# Patient Record
Sex: Male | Born: 1965 | Race: Black or African American | Hispanic: No | Marital: Single | Smoking: Never smoker
Health system: Southern US, Community
[De-identification: ages and names within clinical notes are randomized; demographics above are authoritative.]

## PROBLEM LIST (undated history)

## (undated) DIAGNOSIS — N200 Calculus of kidney: Secondary | ICD-10-CM

---

## 1997-10-27 ENCOUNTER — Emergency Department (HOSPITAL_COMMUNITY): Admission: EM | Admit: 1997-10-27 | Discharge: 1997-10-27 | Payer: Self-pay | Admitting: Emergency Medicine

## 2004-06-19 ENCOUNTER — Emergency Department (HOSPITAL_COMMUNITY): Admission: EM | Admit: 2004-06-19 | Discharge: 2004-06-19 | Payer: Self-pay | Admitting: Emergency Medicine

## 2015-05-18 ENCOUNTER — Ambulatory Visit (INDEPENDENT_AMBULATORY_CARE_PROVIDER_SITE_OTHER): Payer: BLUE CROSS/BLUE SHIELD | Admitting: Physician Assistant

## 2015-05-18 VITALS — BP 134/72 | HR 98 | Temp 98.6°F | Resp 16 | Ht 74.0 in | Wt 197.2 lb

## 2015-05-18 DIAGNOSIS — J069 Acute upper respiratory infection, unspecified: Secondary | ICD-10-CM | POA: Diagnosis not present

## 2015-05-18 DIAGNOSIS — B9789 Other viral agents as the cause of diseases classified elsewhere: Principal | ICD-10-CM

## 2015-05-18 NOTE — Progress Notes (Signed)
   Alan HughsReginald E Sliwa  MRN: [id number] DOB: 29-Mar-1965  Subjective:  Pt presents to clinic with about 2 week h/o cough that seems to be getting better but his dad was worried and he wanted him to be checked.  He does not have any additional symptoms of congestion, sore throat, fever or chills.  He has no problems with heartburn.  He sleeps ok at night.  The cough is now just intermittently through the day.  He was taking mucinex D and it was helping but then he stopped because he did not know how long he should take it at a time.  There are no active problems to display for this patient.   No current outpatient prescriptions on file prior to visit.   No current facility-administered medications on file prior to visit.    Allergies  Allergen Reactions  . Asa [Aspirin] Hives    Review of Systems  Constitutional: Negative for fever and chills.  HENT: Negative for congestion and sore throat.   Respiratory: Positive for cough (dry).        No h/o asthma, nonsmoker  Musculoskeletal: Negative for myalgias.  Neurological: Negative for headaches.   Objective:  BP 134/72 mmHg  Pulse 98  Temp(Src) 98.6 F (37 C) (Oral)  Resp 16  Ht 6\' 2"  (1.88 m)  Wt 197 lb 3.2 oz (89.449 kg)  BMI 25.31 kg/m2  SpO2 98%  Physical Exam  Constitutional: He is oriented to person, place, and time and well-developed, well-nourished, and in no distress.  HENT:  Head: Normocephalic and atraumatic.  Right Ear: Hearing, tympanic membrane, external ear and ear canal normal.  Left Ear: Hearing, tympanic membrane, external ear and ear canal normal.  Nose: Nose normal.  Mouth/Throat: Uvula is midline, oropharynx is clear and moist and mucous membranes are normal.  Eyes: Conjunctivae are normal.  Neck: Normal range of motion.  Cardiovascular: Normal rate, regular rhythm and normal heart sounds.   Pulmonary/Chest: Effort normal and breath sounds normal. He has no wheezes.  Lymphadenopathy:       Head (right  side): No tonsillar adenopathy present.       Head (left side): No tonsillar adenopathy present.    He has no cervical adenopathy.       Right: No supraclavicular adenopathy present.       Left: No supraclavicular adenopathy present.  Neurological: He is alert and oriented to person, place, and time. Gait normal.  Skin: Skin is warm and dry.  Psychiatric: Mood, memory, affect and judgment normal.    Assessment and Plan :  Viral URI with cough   Ok to restart mucinex - cough is improving and now other interventions are needed at this time  Benny LennertSarah Samah Lapiana PA-C  [facility] 05/18/2015 3:14 PM

## 2016-05-17 ENCOUNTER — Emergency Department (HOSPITAL_COMMUNITY)
Admission: EM | Admit: 2016-05-17 | Discharge: 2016-05-17 | Disposition: A | Discharge status: Home / Self Care | Payer: BLUE CROSS/BLUE SHIELD | Attending: Emergency Medicine | Admitting: Emergency Medicine

## 2016-05-17 ENCOUNTER — Other Ambulatory Visit: Payer: Self-pay

## 2016-05-17 ENCOUNTER — Emergency Department (HOSPITAL_COMMUNITY): Payer: BLUE CROSS/BLUE SHIELD

## 2016-05-17 ENCOUNTER — Encounter (HOSPITAL_COMMUNITY): Payer: Self-pay | Admitting: Emergency Medicine

## 2016-05-17 DIAGNOSIS — R079 Chest pain, unspecified: Secondary | ICD-10-CM

## 2016-05-17 DIAGNOSIS — R072 Precordial pain: Secondary | ICD-10-CM | POA: Diagnosis present

## 2016-05-17 LAB — BASIC METABOLIC PANEL
Anion gap: 10 (ref 5–15)
BUN: 13 mg/dL (ref 6–20)
CHLORIDE: 102 mmol/L (ref 101–111)
CO2: 24 mmol/L (ref 22–32)
Calcium: 9 mg/dL (ref 8.9–10.3)
Creatinine, Ser: 1.09 mg/dL (ref 0.61–1.24)
GFR calc Af Amer: >60 mL/min (ref >60)
GFR calc non Af Amer: >60 mL/min (ref >60)
GLUCOSE: 110 mg/dL — AB (ref 65–99)
POTASSIUM: 3.9 mmol/L (ref 3.5–5.1)
Sodium: 136 mmol/L (ref 135–145)

## 2016-05-17 LAB — CBC
HEMATOCRIT: 45.2 % (ref 39.0–52.0)
Hemoglobin: 15.1 g/dL (ref 13.0–17.0)
MCH: 28.6 pg (ref 26.0–34.0)
MCHC: 33.4 g/dL (ref 30.0–36.0)
MCV: 85.6 fL (ref 78.0–100.0)
Platelets: 304 10*3/uL (ref 150–400)
RBC: 5.28 MIL/uL (ref 4.22–5.81)
RDW: 14.6 % (ref 11.5–15.5)
WBC: 5.5 10*3/uL (ref 4.0–10.5)

## 2016-05-17 LAB — TROPONIN I: Troponin I: <0.03 ng/mL (ref <0.03)

## 2016-05-17 LAB — I-STAT TROPONIN, ED: Troponin i, poc: 0 ng/mL (ref 0.00–0.08)

## 2016-05-17 NOTE — ED Provider Notes (Signed)
Pt with negative second tropinin Pt has no chest pain Will d/c pt and he unsderstands f/u   Lorre NickAllen, Shakena Callari, MD 05/17/16 1009

## 2016-05-17 NOTE — Discharge Instructions (Signed)
Follow up with your NP for an outpatient stress test

## 2016-05-17 NOTE — ED Provider Notes (Signed)
[facility]-EMERGENCY DEPT Provider Note   CSN: [id number] Arrival date & time: 05/17/16  0502     History   Chief Complaint Chief Complaint  Patient presents with  . Chest Pain    HPI Alan Garrett is a 51 y.o. male.  The history is provided by the patient.  Chest Pain   This is a new problem. The current episode started 2 days ago. The problem occurs daily. The problem has not changed since onset.The pain is associated with exertion. The pain is present in the substernal region. The pain is mild. The quality of the pain is described as brief. The pain does not radiate. Duration of episode(s) is 5 minutes. Pertinent negatives include no abdominal pain, no cough, no diaphoresis, no fever, no lower extremity edema, no nausea, no shortness of breath, no vomiting and no weakness. He has tried nothing for the symptoms. Risk factors include male gender.  Pertinent negatives for past medical history include no CAD and no PE.  His family medical history is significant for CAD.  pt reports brief episodes of CP Seems to occur with walking Denies weakness/dyspnea/fatigue on ambulation No symptoms while walking up stairs No h/o CAD He had episodes in the past that seemed to improve with drinking water He has never had this evaluated previously  PMH - none Soc hx - nonsmoker Family history - father with CAD Home Medications    Prior to Admission medications   Not on File    Family History History reviewed. No pertinent family history.  Social History Social History  Substance Use Topics  . Smoking status: Never Smoker  . Smokeless tobacco: Never Used  . Alcohol use No     Allergies   Asa [aspirin]   Review of Systems Review of Systems  Constitutional: Negative for diaphoresis and fever.  Respiratory: Negative for cough and shortness of breath.   Cardiovascular: Positive for chest pain.  Gastrointestinal: Negative for abdominal pain, nausea and vomiting.  Neurological:  Negative for syncope and weakness.  All other systems reviewed and are negative.    Physical Exam Updated Vital Signs BP (!) 138/102 (BP Location: Right Arm)   Pulse (!) 103   Temp 98 F (36.7 C) (Oral)   Resp 18   Ht 6\' 2"  (1.88 m)   Wt 87.1 kg   SpO2 98%   BMI 24.65 kg/m   Physical Exam CONSTITUTIONAL: Well developed/well nourished HEAD: Normocephalic/atraumatic EYES: EOMI/PERRL ENMT: Mucous membranes moist NECK: supple no meningeal signs SPINE/BACK:entire spine nontender CV: S1/S2 noted, no murmurs/rubs/gallops noted LUNGS: Lungs are clear to auscultation bilaterally, no apparent distress ABDOMEN: soft, nontender, no rebound or guarding, bowel sounds noted throughout abdomen GU:no cva tenderness NEURO: Pt is awake/alert/appropriate, moves all extremitiesx4.  No facial droop.   EXTREMITIES: pulses normal/equal, full ROM, no calf tenderness or edema SKIN: warm, color normal PSYCH: no abnormalities of mood noted, alert and oriented to situation   ED Treatments / Results  Labs (all labs ordered are listed, but only abnormal results are displayed) Labs Reviewed  BASIC METABOLIC PANEL - Abnormal; Notable for the following:       Result Value   Glucose, Bld 110 (*)    All other components within normal limits  CBC  I-STAT TROPOININ, ED    EKG ED ECG REPORT   Date: 05/17/2016 0514am  Rate: 100  Rhythm: normal sinus rhythm  QRS Axis: normal  Intervals: normal  ST/T Wave abnormalities: normal  Conduction Disutrbances:none  Narrative Interpretation:  Old EKG Reviewed: none available  I have personally reviewed the EKG tracing and agree with the computerized printout as noted.  Radiology Dg Chest 2 View  Result Date: 05/17/2016 CLINICAL DATA:  Intermittent midchest pain for 3 days EXAM: CHEST  2 VIEW COMPARISON:  None. FINDINGS: The lungs are clear. The pulmonary vasculature is normal. Heart size is normal. Hilar and mediastinal contours are unremarkable. There  is no pleural effusion. IMPRESSION: No active cardiopulmonary disease. Electronically Signed   By: Ellery Plunk M.D.   On: 05/17/2016 06:14    Procedures Procedures  Medications Ordered in ED Medications - No data to display   Initial Impression / Assessment and Plan / ED Course  I have reviewed the triage vital signs and the nursing notes.  Pertinent labs & imaging results that were available during my care of the patient were reviewed by me and considered in my medical decision making (see chart for details).     7:11 AM Pt stable No new CP complaints HEART score - 3 I doubt PE at this time Will recheck troponin at 820am If negative he will be safe for d/c home He has an NP as PCP that he sees as an outpatient Signed out to Dr Freida Busman with repeat troponin pending   Final Clinical Impressions(s) / ED Diagnoses   Final diagnoses:  Precordial pain    New Prescriptions New Prescriptions   No medications on file     Zadie Rhine, MD 05/17/16 [phone]

## 2016-05-17 NOTE — ED Triage Notes (Signed)
Pt presents with midsternal CP since  Friday worse with moveement and exertion; pt denies sob, lightheadedness, diaphoresis, n/v; pt denies cardiac hx; pt reports shoulder injury from lifting earlier in the week

## 2017-01-25 ENCOUNTER — Emergency Department (HOSPITAL_COMMUNITY)
Admission: EM | Admit: 2017-01-25 | Discharge: 2017-01-25 | Disposition: A | Discharge status: Home / Self Care | Payer: BLUE CROSS/BLUE SHIELD | Attending: Emergency Medicine | Admitting: Emergency Medicine

## 2017-01-25 ENCOUNTER — Encounter (HOSPITAL_COMMUNITY): Payer: Self-pay | Admitting: Emergency Medicine

## 2017-01-25 ENCOUNTER — Other Ambulatory Visit: Payer: Self-pay

## 2017-01-25 DIAGNOSIS — M2669 Other specified disorders of temporomandibular joint: Secondary | ICD-10-CM | POA: Insufficient documentation

## 2017-01-25 DIAGNOSIS — M26629 Arthralgia of temporomandibular joint, unspecified side: Secondary | ICD-10-CM

## 2017-01-25 DIAGNOSIS — R6884 Jaw pain: Secondary | ICD-10-CM | POA: Diagnosis present

## 2017-01-25 MED ORDER — HYDROCODONE-ACETAMINOPHEN 5-325 MG PO TABS: 2.0000 | ORAL_TABLET | ORAL | 0 refills | Status: DC | PRN

## 2017-01-25 MED ORDER — IBUPROFEN 800 MG PO TABS: 800.0000 mg | ORAL_TABLET | Freq: Three times a day (TID) | ORAL | 0 refills | Status: AC

## 2017-01-25 NOTE — ED Provider Notes (Signed)
Patient placed in Quick Look pathway, seen and evaluated.  Chief Complaint: R sided jaw pain  HPI:  R sided jaw pain, starting at R lower teeth, radiating to ear and chin; tooth sensitivity on lower jaw, worse with movement, no relief with ibuprofen x1  ROS: R sided lower dental pain  Physical Exam:   Gen: No distress  Neuro: Awake and Alert  Skin: Warm  Focused Exam: TTP of gumline in R lower jaw line, TTP of TMJ as well, no trismus, drooling, crepitus, signs of respiratory distress or airway compromise, no s/s of Ludiwg's   Initiation of care has begun. The patient has been counseled on the process, plan, and necessity for staying for the completion/evaluation, and the remainder of the medical screening examination   Dietrich PatesKhatri, Evalisse Prajapati, PA-C 01/25/17 1427    Pricilla LovelessGoldston, Scott, MD 01/25/17 1810

## 2017-01-25 NOTE — Discharge Instructions (Signed)
See your dentist or Dr. Kenney Housemanrab for evaluation of on going jaw pain

## 2017-01-25 NOTE — ED Notes (Signed)
Pt is alert  and oriented x 4 and reports  Rt side Jaw pain x 2 weeks pt reports that he heard a pop sound from that side and since then has had persistent pain.

## 2017-01-25 NOTE — ED Triage Notes (Signed)
Patient c/o intermittent right jaw pain that is upper and lower since December. Reports that he does have a tooth that is sensitive to cold. Patient reports that soon before all this started he was yawning and jaw cracked/popped.

## 2019-07-09 ENCOUNTER — Emergency Department (HOSPITAL_BASED_OUTPATIENT_CLINIC_OR_DEPARTMENT_OTHER)
Admission: EM | Admit: 2019-07-09 | Discharge: 2019-07-09 | Disposition: A | Discharge status: Home / Self Care | Payer: Self-pay | Attending: Emergency Medicine | Admitting: Emergency Medicine

## 2019-07-09 ENCOUNTER — Encounter (HOSPITAL_BASED_OUTPATIENT_CLINIC_OR_DEPARTMENT_OTHER): Payer: Self-pay | Admitting: Emergency Medicine

## 2019-07-09 ENCOUNTER — Other Ambulatory Visit: Payer: Self-pay

## 2019-07-09 ENCOUNTER — Emergency Department (HOSPITAL_BASED_OUTPATIENT_CLINIC_OR_DEPARTMENT_OTHER): Payer: Self-pay

## 2019-07-09 DIAGNOSIS — N200 Calculus of kidney: Secondary | ICD-10-CM

## 2019-07-09 DIAGNOSIS — N132 Hydronephrosis with renal and ureteral calculous obstruction: Secondary | ICD-10-CM | POA: Insufficient documentation

## 2019-07-09 HISTORY — DX: Calculus of kidney: N20.0

## 2019-07-09 LAB — CBC WITH DIFFERENTIAL/PLATELET
Abs Immature Granulocytes: 0.01 10*3/uL (ref 0.00–0.07)
Basophils Absolute: 0.1 10*3/uL (ref 0.0–0.1)
Basophils Relative: 1 %
Eosinophils Absolute: 0 10*3/uL (ref 0.0–0.5)
Eosinophils Relative: 1 %
HCT: 41.6 % (ref 39.0–52.0)
Hemoglobin: 13 g/dL (ref 13.0–17.0)
Immature Granulocytes: 0 %
Lymphocytes Relative: 18 %
Lymphs Abs: 1 10*3/uL (ref 0.7–4.0)
MCH: 27.7 pg (ref 26.0–34.0)
MCHC: 31.3 g/dL (ref 30.0–36.0)
MCV: 88.7 fL (ref 80.0–100.0)
Monocytes Absolute: 0.5 10*3/uL (ref 0.1–1.0)
Monocytes Relative: 10 %
Neutro Abs: 4 10*3/uL (ref 1.7–7.7)
Neutrophils Relative %: 70 %
Platelets: 308 10*3/uL (ref 150–400)
RBC: 4.69 MIL/uL (ref 4.22–5.81)
RDW: 14.6 % (ref 11.5–15.5)
WBC: 5.6 10*3/uL (ref 4.0–10.5)
nRBC: 0 % (ref 0.0–0.2)

## 2019-07-09 LAB — URINALYSIS, ROUTINE W REFLEX MICROSCOPIC
Bilirubin Urine: NEGATIVE
Glucose, UA: NEGATIVE mg/dL
Ketones, ur: NEGATIVE mg/dL
Leukocytes,Ua: NEGATIVE
Nitrite: NEGATIVE
Protein, ur: NEGATIVE mg/dL
Specific Gravity, Urine: 1.02 (ref 1.005–1.030)
pH: 5.5 (ref 5.0–8.0)

## 2019-07-09 LAB — COMPREHENSIVE METABOLIC PANEL
ALT: 14 U/L (ref 0–44)
AST: 19 U/L (ref 15–41)
Albumin: 4.1 g/dL (ref 3.5–5.0)
Alkaline Phosphatase: 62 U/L (ref 38–126)
Anion gap: 9 (ref 5–15)
BUN: 14 mg/dL (ref 6–20)
CO2: 26 mmol/L (ref 22–32)
Calcium: 8.8 mg/dL — ABNORMAL LOW (ref 8.9–10.3)
Chloride: 96 mmol/L — ABNORMAL LOW (ref 98–111)
Creatinine, Ser: 0.86 mg/dL (ref 0.61–1.24)
GFR calc Af Amer: >60 mL/min (ref >60)
GFR calc non Af Amer: >60 mL/min (ref >60)
Glucose, Bld: 137 mg/dL — ABNORMAL HIGH (ref 70–99)
Potassium: 3.6 mmol/L (ref 3.5–5.1)
Sodium: 131 mmol/L — ABNORMAL LOW (ref 135–145)
Total Bilirubin: 0.7 mg/dL (ref 0.3–1.2)
Total Protein: 7.8 g/dL (ref 6.5–8.1)

## 2019-07-09 LAB — URINALYSIS, MICROSCOPIC (REFLEX)

## 2019-07-09 LAB — LIPASE, BLOOD: Lipase: 20 U/L (ref 11–51)

## 2019-07-09 MED ORDER — MORPHINE SULFATE (PF) 4 MG/ML IV SOLN
4.0000 mg | Freq: Once | INTRAVENOUS | Status: AC
  Administered 2019-07-09: 4 mg via INTRAVENOUS
  Filled 2019-07-09: qty 1

## 2019-07-09 MED ORDER — SODIUM CHLORIDE 0.9 % IV BOLUS
1000.0000 mL | Freq: Once | INTRAVENOUS | Status: AC
  Administered 2019-07-09: 1000 mL via INTRAVENOUS

## 2019-07-09 MED ORDER — TAMSULOSIN HCL 0.4 MG PO CAPS: 0.4000 mg | ORAL_CAPSULE | Freq: Every day | ORAL | 0 refills | Status: AC

## 2019-07-09 MED ORDER — IOHEXOL 300 MG/ML  SOLN
100.0000 mL | Freq: Once | INTRAMUSCULAR | Status: AC | PRN
  Administered 2019-07-09: 100 mL via INTRAVENOUS

## 2019-07-09 MED ORDER — ONDANSETRON HCL 4 MG PO TABS: 4.0000 mg | ORAL_TABLET | Freq: Three times a day (TID) | ORAL | 0 refills | Status: AC | PRN

## 2019-07-09 MED ORDER — OXYCODONE HCL 5 MG PO TABS: 5.0000 mg | ORAL_TABLET | Freq: Four times a day (QID) | ORAL | 0 refills | Status: AC | PRN

## 2019-07-09 NOTE — ED Provider Notes (Signed)
[facility] EMERGENCY DEPARTMENT Provider Note   CSN: [id number] Arrival date & time: 07/09/19  7619     History Chief Complaint  Patient presents with  . Abdominal Pain    Alan Garrett is a 54 y.o. male.  The history is provided by the patient.  Abdominal Pain Pain location:  RLQ Pain quality: aching   Pain radiates to:  Groin Pain severity:  Mild Onset quality:  Gradual Duration:  9 hours Timing:  Constant Progression:  Unchanged Chronicity:  New Context: not previous surgeries and not sick contacts   Context comment:  Hx of kidney stones, feels similar but not the worst he has ever felt, has had constant 5/10 pain in the RLQ  Relieved by:  Nothing Ineffective treatments:  None tried Associated symptoms: nausea   Associated symptoms: no anorexia, no belching, no chest pain, no chills, no constipation, no cough, no diarrhea, no dysuria, no fatigue, no fever, no hematemesis, no hematochezia, no hematuria, no shortness of breath, no sore throat and no vomiting   Risk factors: has not had multiple surgeries        Past Medical History:  Diagnosis Date  . Renal stones     There are no problems to display for this patient.   History reviewed. No pertinent surgical history.     No family history on file.  Social History   Tobacco Use  . Smoking status: Never Smoker  . Smokeless tobacco: Never Used  Substance Use Topics  . Alcohol use: No    Alcohol/week: 0.0 standard drinks  . Drug use: No    Home Medications Prior to Admission medications   Medication Sig Start Date End Date Taking? Authorizing Provider  ibuprofen (ADVIL,MOTRIN) 800 MG tablet Take 1 tablet (800 mg total) by mouth 3 (three) times daily. 01/25/17   Elson Areas, PA-C  ondansetron (ZOFRAN) 4 MG tablet Take 1 tablet (4 mg total) by mouth every 8 (eight) hours as needed for up to 20 doses for nausea or vomiting. 07/09/19   Richardson Dubree, DO  oxyCODONE (ROXICODONE) 5 MG  immediate release tablet Take 1 tablet (5 mg total) by mouth every 6 (six) hours as needed for up to 20 doses for severe pain. 07/09/19   Yasmene Salomone, DO  tamsulosin (FLOMAX) 0.4 MG CAPS capsule Take 1 capsule (0.4 mg total) by mouth daily for 5 days. 07/09/19 07/14/19  Virgina Norfolk, DO    Allergies    Asa [aspirin]  Review of Systems   Review of Systems  Constitutional: Negative for chills, fatigue and fever.  HENT: Negative for ear pain and sore throat.   Eyes: Negative for pain and visual disturbance.  Respiratory: Negative for cough and shortness of breath.   Cardiovascular: Negative for chest pain and palpitations.  Gastrointestinal: Positive for abdominal pain and nausea. Negative for anorexia, constipation, diarrhea, hematemesis, hematochezia and vomiting.  Genitourinary: Negative for dysuria and hematuria.  Musculoskeletal: Negative for arthralgias and back pain.  Skin: Negative for color change and rash.  Neurological: Negative for seizures and syncope.  All other systems reviewed and are negative.   Physical Exam Updated Vital Signs BP (!) 149/92 (BP Location: Right Arm)   Pulse 96   Temp 98.1 F (36.7 C) (Oral)   Resp 20   Ht 6\' 1"  (1.854 m)   Wt 82.6 kg   SpO2 100%   BMI 24.01 kg/m   Physical Exam Vitals and nursing note reviewed.  Constitutional:  Appearance: He is well-developed.  HENT:     Head: Normocephalic and atraumatic.  Eyes:     Conjunctiva/sclera: Conjunctivae normal.  Cardiovascular:     Rate and Rhythm: Normal rate and regular rhythm.     Heart sounds: Normal heart sounds. No murmur heard.   Pulmonary:     Effort: Pulmonary effort is normal. No respiratory distress.     Breath sounds: Normal breath sounds.  Abdominal:     Palpations: Abdomen is soft.     Tenderness: There is abdominal tenderness in the right lower quadrant. There is no right CVA tenderness, left CVA tenderness, guarding or rebound. Negative signs include Murphy's sign,  Rovsing's sign, McBurney's sign and psoas sign.  Genitourinary:    Comments: deffered per patient request Musculoskeletal:     Cervical back: Neck supple.  Skin:    General: Skin is warm and dry.     Capillary Refill: Capillary refill takes less than 2 seconds.  Neurological:     General: No focal deficit present.     Mental Status: He is alert.     ED Results / Procedures / Treatments   Labs (all labs ordered are listed, but only abnormal results are displayed) Labs Reviewed  COMPREHENSIVE METABOLIC PANEL - Abnormal; Notable for the following components:      Result Value   Sodium 131 (*)    Chloride 96 (*)    Glucose, Bld 137 (*)    Calcium 8.8 (*)    All other components within normal limits  URINALYSIS, ROUTINE W REFLEX MICROSCOPIC - Abnormal; Notable for the following components:   Hgb urine dipstick SMALL (*)    All other components within normal limits  URINALYSIS, MICROSCOPIC (REFLEX) - Abnormal; Notable for the following components:   Bacteria, UA FEW (*)    All other components within normal limits  URINE CULTURE  CBC WITH DIFFERENTIAL/PLATELET  LIPASE, BLOOD    EKG None  Radiology CT ABDOMEN PELVIS W CONTRAST  Result Date: 07/09/2019 CLINICAL DATA:  Pt w/ right groin pain x 8 hours; pt has h/o kidney stone; nausea; pt has passed stones in the past w/o surgical intervention; no urinary complaints; no gross hematuria EXAM: CT ABDOMEN AND PELVIS WITH CONTRAST TECHNIQUE: Multidetector CT imaging of the abdomen and pelvis was performed using the standard protocol following bolus administration of intravenous contrast. CONTRAST:  132mL OMNIPAQUE IOHEXOL 300 MG/ML  SOLN COMPARISON:  06/19/2004 FINDINGS: Lower chest: Clear lung bases.  Heart normal in size. Hepatobiliary: Liver normal in size and overall attenuation. 7 mm low-density lesion, segment 2. Similar sized low-density lesion in segment 6 adjacent to the gallbladder. These are likely cysts. No other liver  abnormality. Normal gallbladder. No bile duct dilation. Pancreas: Unremarkable. No pancreatic ductal dilatation or surrounding inflammatory changes. Spleen: Normal in size without focal abnormality. Adrenals/Urinary Tract: No adrenal masses. Kidneys normal size, orientation and position with symmetric enhancement. There is mild right hydronephrosis and hydroureter. This is due to a 5 mm stone just above the right ureterovesicular junction. No other ureteral stones. No intrarenal stones. No renal masses. No left hydronephrosis. Normal left ureter. Bladder is unremarkable. Stomach/Bowel: Stomach is within normal limits. Appendix appears normal. No evidence of bowel wall thickening, distention, or inflammatory changes. Vascular/Lymphatic: No significant vascular findings are present. No enlarged abdominal or pelvic lymph nodes. Reproductive: Unremarkable. Other: No abdominal wall hernia or abnormality. No abdominopelvic ascites. Musculoskeletal: No acute or significant osseous findings. IMPRESSION: 1. 5 mm stone in the distal right ureter  just above the ureterovesicular junction causes mild right hydroureteronephrosis. 2. No other acute abnormality within the abdomen or pelvis. 3. No intrarenal stones. 4. Two small liver cysts.  No other abnormalities. Electronically Signed   By: Amie Portland M.D.   On: 07/09/2019 10:00    Procedures Procedures (including critical care time)  Medications Ordered in ED Medications  morphine 4 MG/ML injection 4 mg (4 mg Intravenous Given 07/09/19 0900)  sodium chloride 0.9 % bolus 1,000 mL (1,000 mLs Intravenous New Bag/Given 07/09/19 0900)  iohexol (OMNIPAQUE) 300 MG/ML solution 100 mL (100 mLs Intravenous Contrast Given 07/09/19 0941)    ED Course  I have reviewed the triage vital signs and the nursing notes.  Pertinent labs & imaging results that were available during my care of the patient were reviewed by me and considered in my medical decision making (see chart for  details).    MDM Rules/Calculators/A&P                          Alan Garrett is a 54 year old male with history of kidney stones who presents to the ED with right lower quadrant pain.  Normal vitals.  No fever.  Pain for the last 9 hours.  Some tenderness in the right lower quadrant area but no rebound.  Patient overall appears well.  Patient concerned about kidney stone but states that the pain is not as bad as stones that he had in the past.  Has had nausea but no vomiting.  No hematuria.  Concern for possible appendicitis.  More likely a small kidney stone.  Denies any testicular or penile pain.  Did not want GU exam.  No obvious hernia on exam.  No tenderness in the right upper quadrant and less likely cholecystitis or pancreatitis.  Will get lab work, CT scan abdomen pelvis to evaluate for appendicitis versus kidney stone.  Will check urinalysis.  Will give IV fluids, IV morphine.  CT scan shows 5 mm stone with mild hydronephrosis.  No urinary tract infection.  No leukocytosis.  No fever.  Overall uncomplicated kidney stone.  Pain is well controlled.  Will prescribe narcotics, Zofran, Flomax.  Given information to follow-up with urology.  Understands return precautions and discharged in good condition.  This chart was dictated using voice recognition software.  Despite best efforts to proofread,  errors can occur which can change the documentation meaning.    Final Clinical Impression(s) / ED Diagnoses Final diagnoses:  Kidney stone    Rx / DC Orders ED Discharge Orders         Ordered    oxyCODONE (ROXICODONE) 5 MG immediate release tablet  Every 6 hours PRN     Discontinue  Reprint     07/09/19 1010    ondansetron (ZOFRAN) 4 MG tablet  Every 8 hours PRN     Discontinue  Reprint     07/09/19 1010    tamsulosin (FLOMAX) 0.4 MG CAPS capsule  Daily     Discontinue  Reprint     07/09/19 1010           Chistopher Mangino, DO 07/09/19 1012

## 2019-07-09 NOTE — ED Notes (Signed)
ED Provider at bedside. 

## 2019-07-09 NOTE — ED Triage Notes (Signed)
RLQ pain since 0130, no n/v

## 2019-07-09 NOTE — ED Notes (Signed)
Patient transported to CT 

## 2019-07-10 LAB — URINE CULTURE: Culture: NO GROWTH

## 2019-09-11 ENCOUNTER — Encounter (HOSPITAL_BASED_OUTPATIENT_CLINIC_OR_DEPARTMENT_OTHER): Payer: Self-pay | Admitting: *Deleted

## 2019-09-11 ENCOUNTER — Emergency Department (HOSPITAL_BASED_OUTPATIENT_CLINIC_OR_DEPARTMENT_OTHER)
Admission: EM | Admit: 2019-09-11 | Discharge: 2019-09-12 | Disposition: A | Discharge status: Home / Self Care | Payer: Self-pay | Attending: Emergency Medicine | Admitting: Emergency Medicine

## 2019-09-11 ENCOUNTER — Emergency Department (HOSPITAL_BASED_OUTPATIENT_CLINIC_OR_DEPARTMENT_OTHER): Payer: Self-pay

## 2019-09-11 ENCOUNTER — Other Ambulatory Visit: Payer: Self-pay

## 2019-09-11 DIAGNOSIS — N200 Calculus of kidney: Secondary | ICD-10-CM | POA: Insufficient documentation

## 2019-09-11 LAB — URINALYSIS, ROUTINE W REFLEX MICROSCOPIC
Bilirubin Urine: NEGATIVE
Glucose, UA: NEGATIVE mg/dL
Hgb urine dipstick: NEGATIVE
Ketones, ur: NEGATIVE mg/dL
Leukocytes,Ua: NEGATIVE
Nitrite: NEGATIVE
Protein, ur: NEGATIVE mg/dL
Specific Gravity, Urine: 1.025 (ref 1.005–1.030)
pH: 6 (ref 5.0–8.0)

## 2019-09-11 MED ORDER — TAMSULOSIN HCL 0.4 MG PO CAPS
0.4000 mg | ORAL_CAPSULE | ORAL | Status: AC
  Administered 2019-09-11: 0.4 mg via ORAL
  Filled 2019-09-11: qty 1

## 2019-09-11 MED ORDER — FENTANYL CITRATE (PF) 100 MCG/2ML IJ SOLN: 100.0000 ug | Freq: Once | INTRAMUSCULAR | Status: DC

## 2019-09-11 MED ORDER — DIPHENHYDRAMINE HCL 50 MG/ML IJ SOLN
12.5000 mg | Freq: Once | INTRAMUSCULAR | Status: AC
  Administered 2019-09-11: 12.5 mg via INTRAVENOUS
  Filled 2019-09-11: qty 1

## 2019-09-11 MED ORDER — FENTANYL CITRATE (PF) 100 MCG/2ML IJ SOLN
50.0000 ug | Freq: Once | INTRAMUSCULAR | Status: AC
  Administered 2019-09-11: 50 ug via INTRAVENOUS
  Filled 2019-09-11: qty 2

## 2019-09-11 MED ORDER — KETOROLAC TROMETHAMINE 30 MG/ML IJ SOLN
15.0000 mg | Freq: Once | INTRAMUSCULAR | Status: AC
  Administered 2019-09-11: 15 mg via INTRAVENOUS
  Filled 2019-09-11: qty 1

## 2019-09-11 MED ORDER — ONDANSETRON HCL 4 MG/2ML IJ SOLN
4.0000 mg | Freq: Once | INTRAMUSCULAR | Status: AC
  Administered 2019-09-11: 4 mg via INTRAVENOUS
  Filled 2019-09-11: qty 2

## 2019-09-11 NOTE — ED Notes (Signed)
Attempted PIV twice, unsuccessfully; pt tolerated well 

## 2019-09-11 NOTE — ED Triage Notes (Signed)
Pt c/o right flank pain x 12 hrs  HX stones

## 2019-09-12 ENCOUNTER — Encounter (HOSPITAL_BASED_OUTPATIENT_CLINIC_OR_DEPARTMENT_OTHER): Payer: Self-pay | Admitting: Emergency Medicine

## 2019-09-12 LAB — BASIC METABOLIC PANEL
Anion gap: 9 (ref 5–15)
BUN: 18 mg/dL (ref 6–20)
CO2: 25 mmol/L (ref 22–32)
Calcium: 8.4 mg/dL — ABNORMAL LOW (ref 8.9–10.3)
Chloride: 95 mmol/L — ABNORMAL LOW (ref 98–111)
Creatinine, Ser: 1.41 mg/dL — ABNORMAL HIGH (ref 0.61–1.24)
GFR calc Af Amer: >60 mL/min (ref >60)
GFR calc non Af Amer: 55 mL/min — ABNORMAL LOW (ref >60)
Glucose, Bld: 126 mg/dL — ABNORMAL HIGH (ref 70–99)
Potassium: 4.2 mmol/L (ref 3.5–5.1)
Sodium: 129 mmol/L — ABNORMAL LOW (ref 135–145)

## 2019-09-12 MED ORDER — TAMSULOSIN HCL 0.4 MG PO CAPS: 0.4000 mg | ORAL_CAPSULE | Freq: Every day | ORAL | 0 refills | Status: AC

## 2019-09-12 MED ORDER — ONDANSETRON 8 MG PO TBDP: ORAL_TABLET | ORAL | 0 refills | Status: AC

## 2019-09-12 MED ORDER — TRAMADOL HCL 50 MG PO TABS: 50.0000 mg | ORAL_TABLET | Freq: Four times a day (QID) | ORAL | 0 refills | Status: AC | PRN

## 2019-09-12 NOTE — ED Provider Notes (Signed)
[facility] EMERGENCY DEPARTMENT Provider Note   CSN: [id number] Arrival date & time: 09/11/19  1715     History Chief Complaint  Patient presents with  . Flank Pain    Alan Garrett is a 54 y.o. male.  The history is provided by the patient.  Flank Pain This is a recurrent problem. The current episode started 12 to 24 hours ago. The problem occurs constantly. The problem has not changed since onset.Pertinent negatives include no chest pain, no abdominal pain, no headaches and no shortness of breath. Nothing aggravates the symptoms. Nothing relieves the symptoms. He has tried nothing for the symptoms. The treatment provided no relief.  Right flank pain.  No hematuria.  No n/v/d.  No f/c/r.      Past Medical History:  Diagnosis Date  . Renal stones     There are no problems to display for this patient.   History reviewed. No pertinent surgical history.     No family history on file.  Social History   Tobacco Use  . Smoking status: Never Smoker  . Smokeless tobacco: Never Used  Substance Use Topics  . Alcohol use: No    Alcohol/week: 0.0 standard drinks  . Drug use: No    Home Medications Prior to Admission medications   Medication Sig Start Date End Date Taking? Authorizing Provider  ibuprofen (ADVIL,MOTRIN) 800 MG tablet Take 1 tablet (800 mg total) by mouth 3 (three) times daily. 01/25/17   Elson Areas, PA-C  ondansetron (ZOFRAN) 4 MG tablet Take 1 tablet (4 mg total) by mouth every 8 (eight) hours as needed for up to 20 doses for nausea or vomiting. 07/09/19   Curatolo, Adam, DO  oxyCODONE (ROXICODONE) 5 MG immediate release tablet Take 1 tablet (5 mg total) by mouth every 6 (six) hours as needed for up to 20 doses for severe pain. 07/09/19   Virgina Norfolk, DO    Allergies    Asa [aspirin]  Review of Systems   Review of Systems  Constitutional: Negative for fever.  HENT: Negative for congestion.   Eyes: Negative for visual disturbance.    Respiratory: Negative for shortness of breath.   Cardiovascular: Negative for chest pain.  Gastrointestinal: Negative for abdominal pain and vomiting.  Genitourinary: Positive for flank pain. Negative for hematuria.  Musculoskeletal: Negative for arthralgias.  Neurological: Negative for headaches.  Psychiatric/Behavioral: Negative for agitation.  All other systems reviewed and are negative.   Physical Exam Updated Vital Signs BP (!) 149/84 (BP Location: Right Arm)   Pulse 93   Temp 97.6 F (36.4 C) (Oral)   Resp 19   Ht 6\' 1"  (1.854 m)   Wt 81.6 kg   SpO2 100%   BMI 23.75 kg/m   Physical Exam Vitals and nursing note reviewed.  Constitutional:      General: He is not in acute distress.    Appearance: Normal appearance.  HENT:     Head: Normocephalic and atraumatic.     Nose: Nose normal.  Eyes:     Conjunctiva/sclera: Conjunctivae normal.     Pupils: Pupils are equal, round, and reactive to light.  Cardiovascular:     Rate and Rhythm: Normal rate and regular rhythm.     Pulses: Normal pulses.     Heart sounds: Normal heart sounds.  Pulmonary:     Effort: Pulmonary effort is normal.     Breath sounds: Normal breath sounds.  Abdominal:     General: Abdomen is flat. Bowel  sounds are normal.     Tenderness: There is no abdominal tenderness. There is no guarding.  Musculoskeletal:        General: Normal range of motion.     Cervical back: Normal range of motion and neck supple.  Skin:    General: Skin is warm and dry.     Capillary Refill: Capillary refill takes less than 2 seconds.  Neurological:     General: No focal deficit present.     Mental Status: He is alert and oriented to person, place, and time.     Deep Tendon Reflexes: Reflexes normal.  Psychiatric:        Mood and Affect: Mood normal.        Behavior: Behavior normal.     ED Results / Procedures / Treatments   Labs (all labs ordered are listed, but only abnormal results are displayed) Results  for orders placed or performed during the hospital encounter of 09/11/19  Urinalysis, Routine w reflex microscopic  Result Value Ref Range   Color, Urine YELLOW YELLOW   APPearance CLEAR CLEAR   Specific Gravity, Urine 1.025 1.005 - 1.030   pH 6.0 5.0 - 8.0   Glucose, UA NEGATIVE NEGATIVE mg/dL   Hgb urine dipstick NEGATIVE NEGATIVE   Bilirubin Urine NEGATIVE NEGATIVE   Ketones, ur NEGATIVE NEGATIVE mg/dL   Protein, ur NEGATIVE NEGATIVE mg/dL   Nitrite NEGATIVE NEGATIVE   Leukocytes,Ua NEGATIVE NEGATIVE   CT Renal Stone Study  Result Date: 09/11/2019 CLINICAL DATA:  Right flank pain EXAM: CT ABDOMEN AND PELVIS WITHOUT CONTRAST TECHNIQUE: Multidetector CT imaging of the abdomen and pelvis was performed following the standard protocol without IV contrast. COMPARISON:  CT 07/09/2019 FINDINGS: Lower chest: No acute abnormality. Hepatobiliary: Liver is incompletely visualized. No calcified gallstone or biliary dilatation Pancreas: Unremarkable. No pancreatic ductal dilatation or surrounding inflammatory changes. Spleen: Normal in size without focal abnormality. Adrenals/Urinary Tract: Adrenal glands are normal. Moderate hydronephrosis and hydroureter right kidney with perinephric stranding and fluid. This is secondary to a 6 x 7 mm stone in the distal right ureter just proximal to the UVJ. Bladder is nearly empty Stomach/Bowel: Stomach is within normal limits. Appendix appears normal. No evidence of bowel wall thickening, distention, or inflammatory changes. Vascular/Lymphatic: No significant vascular findings are present. No enlarged abdominal or pelvic lymph nodes. Reproductive: Prostate is unremarkable. Other: No abdominal wall hernia or abnormality. No abdominopelvic ascites. Musculoskeletal: No acute or significant osseous findings. IMPRESSION: Moderate right hydronephrosis and hydroureter with perinephric stranding and fluid. This is secondary to a 6 x 7 mm stone in the distal right ureter just  proximal to the UVJ. Electronically Signed   By: Jasmine Pang M.D.   On: 09/11/2019 18:17    CT Renal Stone Study  Result Date: 09/11/2019 CLINICAL DATA:  Right flank pain EXAM: CT ABDOMEN AND PELVIS WITHOUT CONTRAST TECHNIQUE: Multidetector CT imaging of the abdomen and pelvis was performed following the standard protocol without IV contrast. COMPARISON:  CT 07/09/2019 FINDINGS: Lower chest: No acute abnormality. Hepatobiliary: Liver is incompletely visualized. No calcified gallstone or biliary dilatation Pancreas: Unremarkable. No pancreatic ductal dilatation or surrounding inflammatory changes. Spleen: Normal in size without focal abnormality. Adrenals/Urinary Tract: Adrenal glands are normal. Moderate hydronephrosis and hydroureter right kidney with perinephric stranding and fluid. This is secondary to a 6 x 7 mm stone in the distal right ureter just proximal to the UVJ. Bladder is nearly empty Stomach/Bowel: Stomach is within normal limits. Appendix appears normal. No evidence of  bowel wall thickening, distention, or inflammatory changes. Vascular/Lymphatic: No significant vascular findings are present. No enlarged abdominal or pelvic lymph nodes. Reproductive: Prostate is unremarkable. Other: No abdominal wall hernia or abnormality. No abdominopelvic ascites. Musculoskeletal: No acute or significant osseous findings. IMPRESSION: Moderate right hydronephrosis and hydroureter with perinephric stranding and fluid. This is secondary to a 6 x 7 mm stone in the distal right ureter just proximal to the UVJ. Electronically Signed   By: Jasmine Pang M.D.   On: 09/11/2019 18:17    Procedures Procedures (including critical care time)  Medications Ordered in ED Medications  ondansetron (ZOFRAN) injection 4 mg (4 mg Intravenous Given 09/11/19 2357)  tamsulosin (FLOMAX) capsule 0.4 mg (0.4 mg Oral Given 09/11/19 2332)  ketorolac (TORADOL) 30 MG/ML injection 15 mg (15 mg Intravenous Given 09/11/19 2357)    fentaNYL (SUBLIMAZE) injection 50 mcg (50 mcg Intravenous Given 09/11/19 2357)  diphenhydrAMINE (BENADRYL) injection 12.5 mg (12.5 mg Intravenous Given 09/11/19 2357)    ED Course  I have reviewed the triage vital signs and the nursing notes.  Pertinent labs & imaging results that were available during my care of the patient were reviewed by me and considered in my medical decision making (see chart for details).    Pain markedly improved.  Hydrate well, follow up with urology as directed.  Strict return precautions given.    Alan Garrett was evaluated in Emergency Department on 09/12/2019 for the symptoms described in the history of present illness. He was evaluated in the context of the global COVID-19 pandemic, which necessitated consideration that the patient might be at risk for infection with the SARS-CoV-2 virus that causes COVID-19. Institutional protocols and algorithms that pertain to the evaluation of patients at risk for COVID-19 are in a state of rapid change based on information released by regulatory bodies including the CDC and federal and state organizations. These policies and algorithms were followed during the patient's care in the ED.  Final Clinical Impression(s) / ED Diagnoses  Return for intractable cough, coughing up blood,fevers >100.4 unrelieved by medication, shortness of breath, intractable vomiting, chest pain, shortness of breath, weakness,numbness, changes in speech, facial asymmetry,abdominal pain, passing out,Inability to tolerate liquids or food, cough, altered mental status or any concerns. No signs of systemic illness or infection. The patient is nontoxic-appearing on exam and vital signs are within normal limits.   I have reviewed the triage vital signs and the nursing notes. Pertinent labs &imaging results that were available during my care of the patient were reviewed by me and considered in my medical decision making (see chart for  details).After history, exam, and medical workup I feel the patient has beenappropriately medically screened and is safe for discharge home. Pertinent diagnoses were discussed with the patient. Patient was given return precautions.   Alan Fretwell, MD 09/12/19 0201

## 2021-03-29 IMAGING — CT CT RENAL STONE PROTOCOL
2 of 4 series · 17 of 46 positions shown, 19 images · non-contrast
Comparison: CT 07/09/2019

CLINICAL DATA: Right flank pain

EXAM:
CT ABDOMEN AND PELVIS WITHOUT CONTRAST
TECHNIQUE: Multidetector CT imaging of the abdomen and pelvis was performed
following the standard protocol without IV contrast.

[Series 2: axial st · axial · 0.67mm/px · z∈[-237,+133]mm · 14 of 82 slices shown, 16 images]
[im 4/82  soft-tissue]
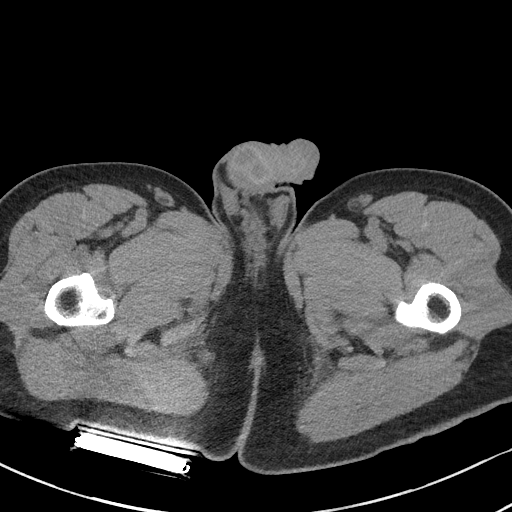
[im 4/82  bone]
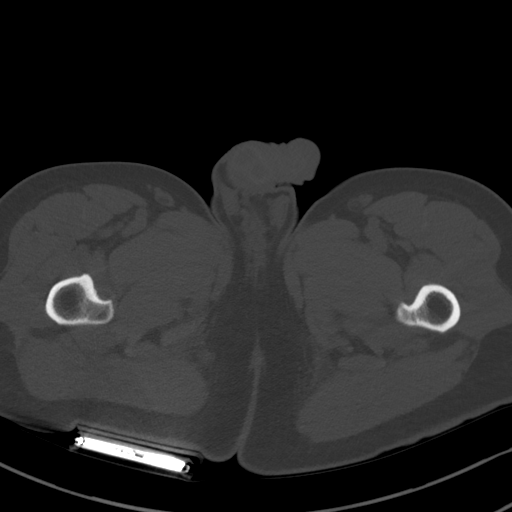
[im 10/82  soft-tissue]
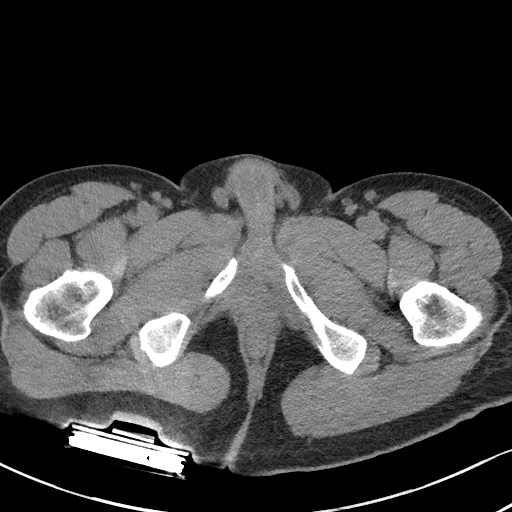
[im 16/82  soft-tissue]
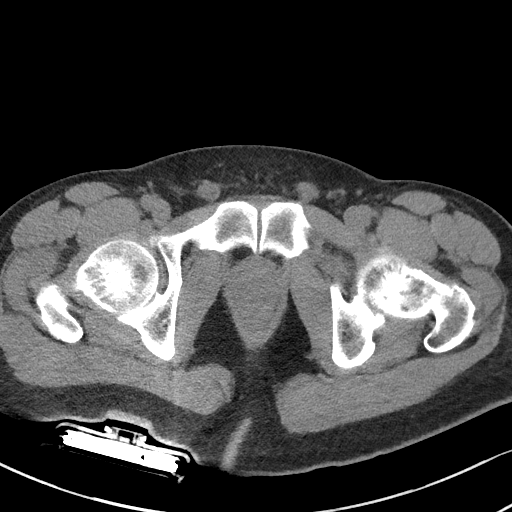
[im 22/82  soft-tissue]
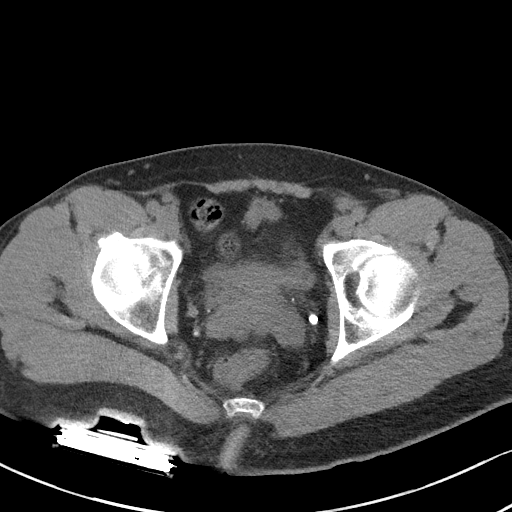
[im 29/82  soft-tissue]
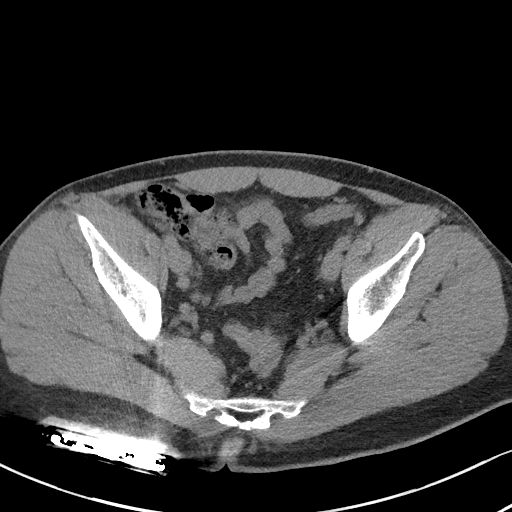
[im 32/82  soft-tissue]
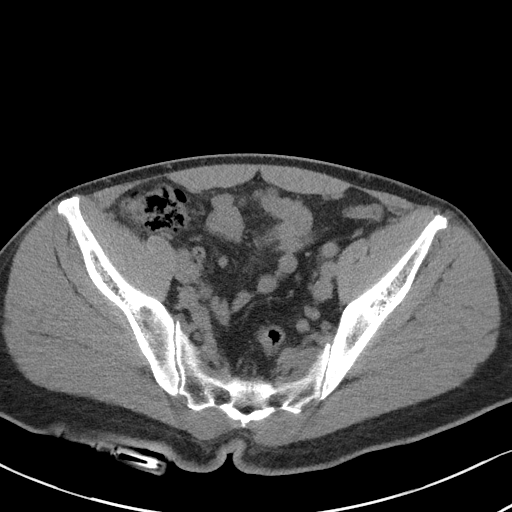
[im 38/82  soft-tissue]
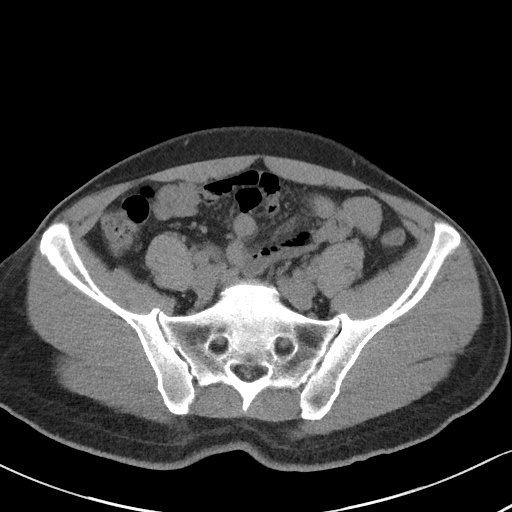
[im 44/82  soft-tissue]
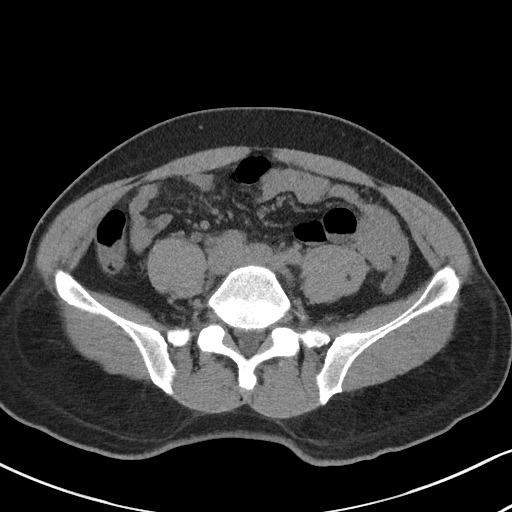
[im 50/82  soft-tissue]
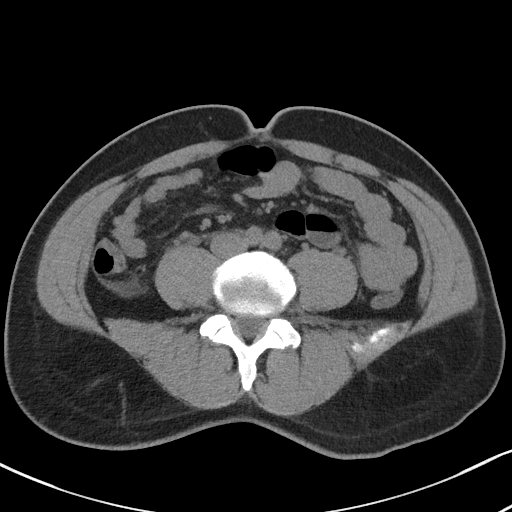
[im 50/82  bone]
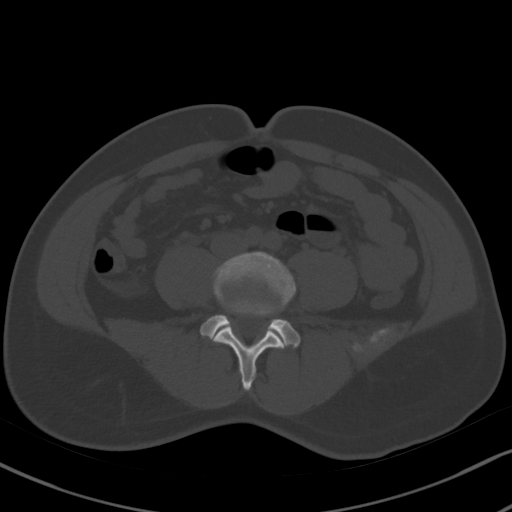
[im 53/82  soft-tissue]
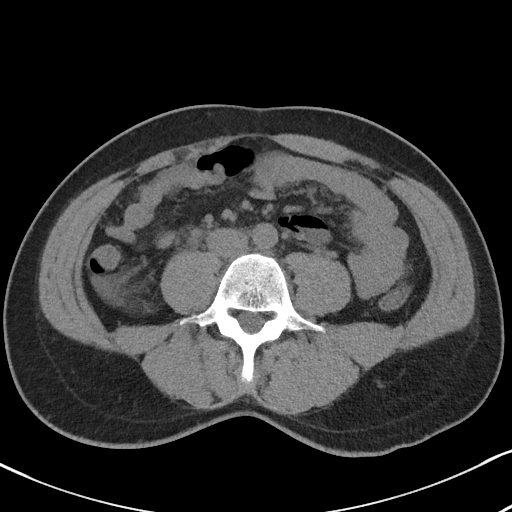
[im 60/82  soft-tissue]
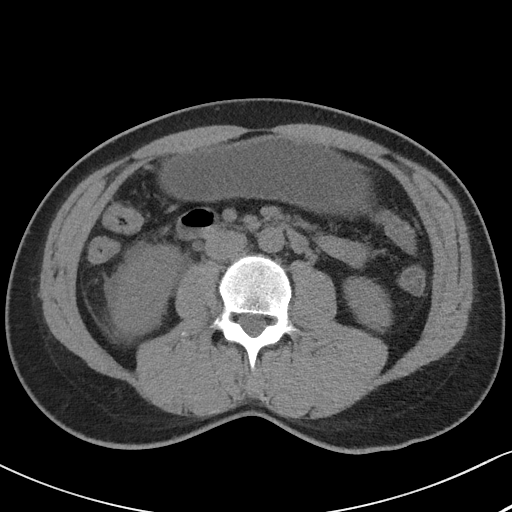
[im 66/82  soft-tissue]
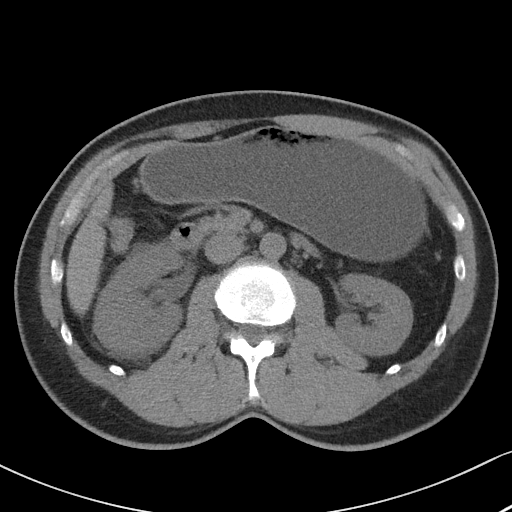
[im 72/82  soft-tissue]
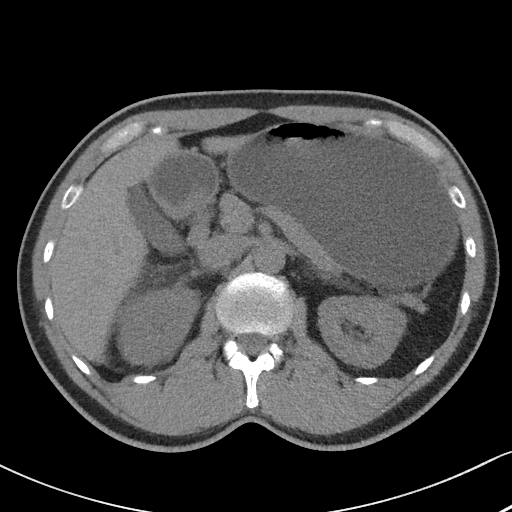
[im 78/82  soft-tissue]
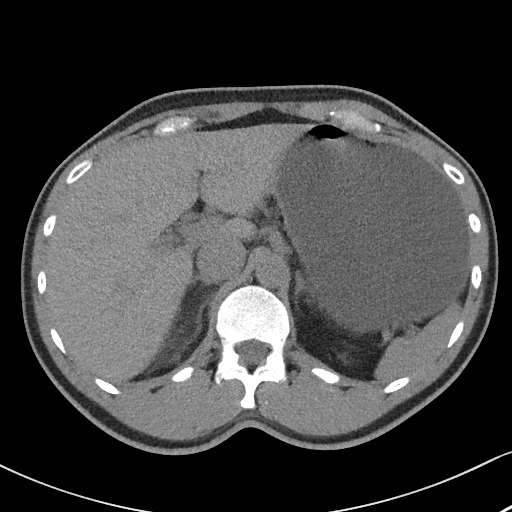

[Series 4: coronal st · coronal · 0.77mm/px · 3 of 101 slices shown]
[im 34/101  soft-tissue]
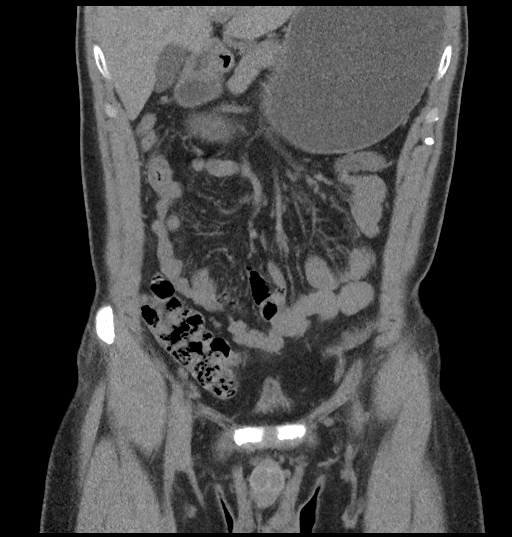
[im 45/101  soft-tissue]
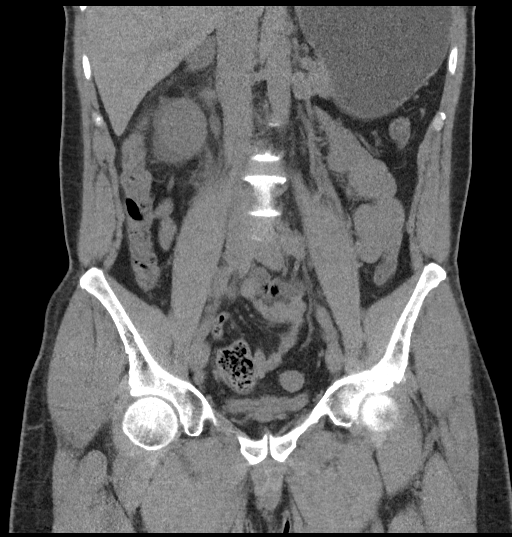
[im 56/101  soft-tissue]
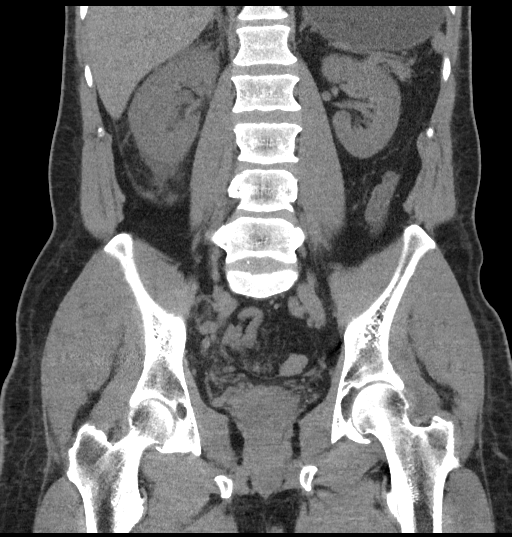

[17 of 46 positions shown; findings below may reference images not displayed]

FINDINGS: Lower chest: No acute abnormality.

Hepatobiliary: Liver is incompletely visualized. No calcified
gallstone or biliary dilatation

Pancreas: Unremarkable. No pancreatic ductal dilatation or
surrounding inflammatory changes.

Spleen: Normal in size without focal abnormality.

Adrenals/Urinary Tract: Adrenal glands are normal. Moderate
hydronephrosis and hydroureter right kidney with perinephric
stranding and fluid. This is secondary to a 6 x 7 mm stone in the
distal right ureter just proximal to the UVJ. Bladder is nearly
empty

Stomach/Bowel: Stomach is within normal limits. Appendix appears
normal. No evidence of bowel wall thickening, distention, or
inflammatory changes.

Vascular/Lymphatic: No significant vascular findings are present. No
enlarged abdominal or pelvic lymph nodes.

Reproductive: Prostate is unremarkable.

Other: No abdominal wall hernia or abnormality. No abdominopelvic
ascites.

Musculoskeletal: No acute or significant osseous findings.
IMPRESSION: Moderate right hydronephrosis and hydroureter with perinephric
stranding and fluid. This is secondary to a 6 x 7 mm stone in the
distal right ureter just proximal to the UVJ.
